# Patient Record
Sex: Male | Born: 1995 | Race: Black or African American | Hispanic: No | Smoking: Never smoker
Health system: Southern US, Community
[De-identification: ages and names within clinical notes are randomized; demographics above are authoritative.]

## PROBLEM LIST (undated history)

## (undated) HISTORY — PX: HERNIA REPAIR: SHX51

---
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
## 2021-04-13 ENCOUNTER — Other Ambulatory Visit: Payer: Self-pay

## 2021-04-13 ENCOUNTER — Ambulatory Visit
Admission: EM | Admit: 2021-04-13 | Discharge: 2021-04-13 | Disposition: A | Discharge status: Home / Self Care | Payer: Managed Care, Other (non HMO) | Attending: Emergency Medicine | Admitting: Emergency Medicine

## 2021-04-13 DIAGNOSIS — M546 Pain in thoracic spine: Secondary | ICD-10-CM

## 2021-04-13 DIAGNOSIS — M25511 Pain in right shoulder: Secondary | ICD-10-CM

## 2021-04-13 MED ORDER — IBUPROFEN 800 MG PO TABS: 800.0000 mg | ORAL_TABLET | Freq: Three times a day (TID) | ORAL | 0 refills | Status: DC

## 2021-04-13 MED ORDER — TIZANIDINE HCL 2 MG PO TABS: 2.0000 mg | ORAL_TABLET | Freq: Four times a day (QID) | ORAL | 0 refills | Status: DC | PRN
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
## 2021-04-13 NOTE — Discharge Instructions (Signed)
Use anti-inflammatories for pain/swelling. You may take up to 800 mg Ibuprofen every 8 hours with food. You may supplement Ibuprofen with Tylenol [phone] mg every 8 hours.  You may use tizanidine as needed to help with pain. This is a muscle relaxer and causes sedation- please use only at bedtime or when you will be home and not have to drive/work Gentle stretching Alternate ice and heat Follow-up if not improving or worsening
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
## 2021-06-06 ENCOUNTER — Encounter (HOSPITAL_COMMUNITY): Payer: Self-pay

## 2021-06-06 ENCOUNTER — Ambulatory Visit (HOSPITAL_COMMUNITY)
Admission: RE | Admit: 2021-06-06 | Discharge: 2021-06-06 | Disposition: A | Discharge status: Home / Self Care | Payer: Self-pay | Source: Ambulatory Visit | Attending: Internal Medicine | Admitting: Internal Medicine

## 2021-06-06 ENCOUNTER — Other Ambulatory Visit: Payer: Self-pay

## 2021-06-06 VITALS — BP 116/78 | HR 81 | Temp 98.6°F | Resp 20

## 2021-06-06 DIAGNOSIS — J039 Acute tonsillitis, unspecified: Secondary | ICD-10-CM

## 2021-06-06 DIAGNOSIS — J029 Acute pharyngitis, unspecified: Secondary | ICD-10-CM

## 2021-06-06 LAB — POCT RAPID STREP A, ED / UC: Streptococcus, Group A Screen (Direct): NEGATIVE

## 2021-06-06 MED ORDER — AMOXICILLIN 875 MG PO TABS: 875.0000 mg | ORAL_TABLET | Freq: Two times a day (BID) | ORAL | 0 refills | Status: AC

## 2021-06-06 MED ORDER — PREDNISONE 20 MG PO TABS: 40.0000 mg | ORAL_TABLET | Freq: Every day | ORAL | 0 refills | Status: AC

## 2021-06-06 NOTE — Discharge Instructions (Addendum)
You are being treated for sore throat and acute tonsillitis with amoxicillin antibiotic and prednisone steroid to decrease inflammation and treat infection.  You have been provided with contact information for ear, nose, and throat specialist.  You may call them for further evaluation of chronically enlarged tonsils.

## 2021-06-06 NOTE — ED Triage Notes (Signed)
Pt presents with a sore throat and lymph node swelling on right side.   States he had a fever and chills last night.

## 2021-06-06 NOTE — ED Provider Notes (Signed)
[facility]    CSN: [id number] Arrival date & time: 06/06/21  1451      History   Chief Complaint Chief Complaint  Patient presents with   Appointment   Sore Throat    HPI Philip Owens is a 25 y.o. male.   Patient presents for 4-day history of sore throat, right lymph node swelling, and enlarged tonsils.  Patient states that he had a fever of T-max of 100 at home.  Denies any upper respiratory symptoms or ear pain.  Denies any known sick contacts.  Patient states that he had a negative COVID-19 test completed yesterday at [facility].   Sore Throat   History reviewed. No pertinent past medical history.  Patient Active Problem List   Diagnosis Date Noted   Bilateral knee pain 04/23/2012    History reviewed. No pertinent surgical history.     Home Medications    Prior to Admission medications   Medication Sig Start Date End Date Taking? Authorizing Provider  amoxicillin (AMOXIL) 875 MG tablet Take 1 tablet (875 mg total) by mouth 2 (two) times daily for 10 days. 06/06/21 06/16/21 Yes Lance Muss, FNP  predniSONE (DELTASONE) 20 MG tablet Take 2 tablets (40 mg total) by mouth daily for 5 days. 06/06/21 06/11/21 Yes Lance Muss, FNP  ibuprofen (ADVIL) 800 MG tablet Take 1 tablet (800 mg total) by mouth 3 (three) times daily. 04/13/21   Wieters, Hallie C, PA-C  tiZANidine (ZANAFLEX) 2 MG tablet Take 1-2 tablets (2-4 mg total) by mouth every 6 (six) hours as needed for muscle spasms. 04/13/21   Wieters, Junius Creamer, PA-C    Family History Family History  Problem Relation Age of Onset   Healthy Mother    Healthy Father    Sudden death Neg Hx    Hyperlipidemia Neg Hx    Heart attack Neg Hx    Diabetes Neg Hx    Hypertension Neg Hx     Social History Social History   Tobacco Use   Smoking status: Never   Smokeless tobacco: Never     Allergies   Patient has no known allergies.   Review of Systems Review of Systems Per HPI  Physical  Exam Triage Vital Signs ED Triage Vitals  Enc Vitals Group     BP 06/06/21 1524 116/78     Pulse Rate 06/06/21 1524 81     Resp 06/06/21 1524 20     Temp 06/06/21 1524 98.6 F (37 C)     Temp Source 06/06/21 1524 Oral     SpO2 06/06/21 1524 98 %     Weight --      Height --      Head Circumference --      Peak Flow --      Pain Score 06/06/21 1522 4     Pain Loc --      Pain Edu? --      Excl. in GC? --    No data found.  Updated Vital Signs BP 116/78 (BP Location: Right Arm)   Pulse 81   Temp 98.6 F (37 C) (Oral)   Resp 20   SpO2 98%   Visual Acuity Right Eye Distance:   Left Eye Distance:   Bilateral Distance:    Right Eye Near:   Left Eye Near:    Bilateral Near:     Physical Exam Constitutional:      General: He is not in acute distress.    Appearance:  Normal appearance.  HENT:     Head: Normocephalic and atraumatic.     Right Ear: Ear canal normal. A middle ear effusion is present. Tympanic membrane is not erythematous or bulging.     Left Ear: Tympanic membrane and ear canal normal. Tympanic membrane is not erythematous or bulging.     Nose: Nose normal.     Mouth/Throat:     Pharynx: Posterior oropharyngeal erythema present.     Tonsils: 3+ on the right. 3+ on the left.  Eyes:     Extraocular Movements: Extraocular movements intact.     Conjunctiva/sclera: Conjunctivae normal.  Neck:     Comments: Right cervical lymph node swelling. Cardiovascular:     Rate and Rhythm: Normal rate and regular rhythm.     Pulses: Normal pulses.     Heart sounds: Normal heart sounds.  Pulmonary:     Effort: Pulmonary effort is normal. No respiratory distress.     Breath sounds: Normal breath sounds.  Lymphadenopathy:     Cervical: Cervical adenopathy present.  Skin:    General: Skin is warm and dry.  Neurological:     General: No focal deficit present.     Mental Status: He is alert and oriented to person, place, and time. Mental status is at baseline.   Psychiatric:        Mood and Affect: Mood normal.        Behavior: Behavior normal.        Thought Content: Thought content normal.        Judgment: Judgment normal.     UC Treatments / Results  Labs (all labs ordered are listed, but only abnormal results are displayed) Labs Reviewed  POCT RAPID STREP A, ED / UC    EKG   Radiology No results found.  Procedures Procedures (including critical care time)  Medications Ordered in UC Medications - No data to display  Initial Impression / Assessment and Plan / UC Course  I have reviewed the triage vital signs and the nursing notes.  Pertinent labs & imaging results that were available during my care of the patient were reviewed by me and considered in my medical decision making (see chart for details).     Rapid strep test was negative in urgent care today.  Although, suspicious of either false negative strep throat test or other variant of strep causing sore throat due to appearance of posterior pharynx and enlarged tonsils on physical exam.  Will treat with amoxicillin x10 days and prednisone x5 days to decrease size of tonsils.  Patient does state that he has chronically enlarged tonsils, but these are larger than normal.  Patient referred to ENT specialist for further evaluation and management of chronically enlarged tonsils.  Throat culture is pending.Discussed strict return precautions. Patient verbalized understanding and is agreeable with plan.  Final Clinical Impressions(s) / UC Diagnoses   Final diagnoses:  Pharyngitis, unspecified etiology  Tonsillitis     Discharge Instructions      You are being treated for sore throat and acute tonsillitis with amoxicillin antibiotic and prednisone steroid to decrease inflammation and treat infection.  You have been provided with contact information for ear, nose, and throat specialist.  You may call them for further evaluation of chronically enlarged tonsils.     ED  Prescriptions     Medication Sig Dispense Auth. Provider   amoxicillin (AMOXIL) 875 MG tablet Take 1 tablet (875 mg total) by mouth 2 (two) times daily for 10 days.  20 tablet Lance Muss, FNP   predniSONE (DELTASONE) 20 MG tablet Take 2 tablets (40 mg total) by mouth daily for 5 days. 10 tablet Lance Muss, FNP      PDMP not reviewed this encounter.   Lance Muss, FNP 06/06/21 [phone]

## 2022-05-10 ENCOUNTER — Ambulatory Visit: Payer: Self-pay | Admitting: *Deleted

## 2022-12-20 ENCOUNTER — Emergency Department (HOSPITAL_COMMUNITY)
Admission: EM | Admit: 2022-12-20 | Discharge: 2022-12-20 | Disposition: A | Discharge status: Home / Self Care | Payer: 59 | Attending: Student | Admitting: Student

## 2022-12-20 ENCOUNTER — Other Ambulatory Visit: Payer: Self-pay

## 2022-12-20 ENCOUNTER — Emergency Department (HOSPITAL_COMMUNITY): Payer: 59

## 2022-12-20 ENCOUNTER — Encounter (HOSPITAL_COMMUNITY): Payer: Self-pay

## 2022-12-20 DIAGNOSIS — M25511 Pain in right shoulder: Secondary | ICD-10-CM

## 2022-12-20 MED ORDER — IBUPROFEN 600 MG PO TABS: 600.0000 mg | ORAL_TABLET | Freq: Four times a day (QID) | ORAL | 0 refills | Status: DC | PRN

## 2022-12-20 MED ORDER — LIDOCAINE 5 % EX PTCH
1.0000 | MEDICATED_PATCH | CUTANEOUS | Status: DC
  Administered 2022-12-20: 1 via TRANSDERMAL
  Filled 2022-12-20: qty 1

## 2022-12-20 MED ORDER — LIDOCAINE 5 % EX PTCH: 1.0000 | MEDICATED_PATCH | CUTANEOUS | 0 refills | Status: DC

## 2022-12-20 MED ORDER — IBUPROFEN 400 MG PO TABS
600.0000 mg | ORAL_TABLET | Freq: Once | ORAL | Status: AC
  Administered 2022-12-20: 600 mg via ORAL
  Filled 2022-12-20: qty 1

## 2022-12-20 NOTE — Discharge Instructions (Signed)
Seen today for right shoulder pain.  X-rays are normal, likely due to muscle strain or related to your rotator cuff tear.  Use a sling as needed for comfort and use the ibuprofen as needed for pain.  Come back to the ER for any new or worsening symptoms such as swelling fevers or chills or other worsening symptoms.
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
## 2022-12-20 NOTE — ED Triage Notes (Signed)
Pt presents with a sudden onset l shoulder pain that started when trying to reach over his head. Pt reports previous rotator cuff injury to that shoulder.

## 2022-12-20 NOTE — ED Notes (Signed)
Pt verbalizes understanding of discharge instructions. Opportunity for questions and answers were provided. Pt discharged from the ED.   ?

## 2023-02-14 ENCOUNTER — Emergency Department (HOSPITAL_COMMUNITY): Payer: Managed Care, Other (non HMO)

## 2023-02-14 ENCOUNTER — Emergency Department (HOSPITAL_COMMUNITY)
Admission: EM | Admit: 2023-02-14 | Discharge: 2023-02-14 | Disposition: A | Discharge status: Home / Self Care | Payer: Managed Care, Other (non HMO) | Attending: Emergency Medicine | Admitting: Emergency Medicine

## 2023-02-14 DIAGNOSIS — M25812 Other specified joint disorders, left shoulder: Secondary | ICD-10-CM | POA: Diagnosis not present

## 2023-02-14 DIAGNOSIS — M542 Cervicalgia: Secondary | ICD-10-CM | POA: Diagnosis not present

## 2023-02-14 DIAGNOSIS — Z23 Encounter for immunization: Secondary | ICD-10-CM | POA: Insufficient documentation

## 2023-02-14 DIAGNOSIS — S50312A Abrasion of left elbow, initial encounter: Secondary | ICD-10-CM | POA: Diagnosis not present

## 2023-02-14 DIAGNOSIS — M19019 Primary osteoarthritis, unspecified shoulder: Secondary | ICD-10-CM

## 2023-02-14 DIAGNOSIS — Y92410 Unspecified street and highway as the place of occurrence of the external cause: Secondary | ICD-10-CM | POA: Diagnosis not present

## 2023-02-14 DIAGNOSIS — M25562 Pain in left knee: Secondary | ICD-10-CM | POA: Diagnosis not present

## 2023-02-14 DIAGNOSIS — S59902A Unspecified injury of left elbow, initial encounter: Secondary | ICD-10-CM | POA: Diagnosis present

## 2023-02-14 LAB — I-STAT CHEM 8, ED
BUN: 9 mg/dL (ref 6–20)
Calcium, Ion: 1.18 mmol/L (ref 1.15–1.40)
Chloride: 100 mmol/L (ref 98–111)
Creatinine, Ser: 0.9 mg/dL (ref 0.61–1.24)
Glucose, Bld: 98 mg/dL (ref 70–99)
HCT: 46 % (ref 39.0–52.0)
Hemoglobin: 15.6 g/dL (ref 13.0–17.0)
Potassium: 3.6 mmol/L (ref 3.5–5.1)
Sodium: 141 mmol/L (ref 135–145)
TCO2: 29 mmol/L (ref 22–32)

## 2023-02-14 MED ORDER — FENTANYL CITRATE PF 50 MCG/ML IJ SOSY: 50.0000 ug | PREFILLED_SYRINGE | Freq: Once | INTRAMUSCULAR | Status: AC

## 2023-02-14 MED ORDER — TETANUS-DIPHTH-ACELL PERTUSSIS 5-2.5-18.5 LF-MCG/0.5 IM SUSY
0.5000 mL | PREFILLED_SYRINGE | Freq: Once | INTRAMUSCULAR | Status: AC
  Administered 2023-02-14: 0.5 mL via INTRAMUSCULAR

## 2023-02-14 MED ORDER — FENTANYL CITRATE PF 50 MCG/ML IJ SOSY
PREFILLED_SYRINGE | INTRAMUSCULAR | Status: AC
  Administered 2023-02-14: 50 ug via INTRAVENOUS
  Filled 2023-02-14: qty 1

## 2023-02-14 MED ORDER — OXYCODONE-ACETAMINOPHEN 5-325 MG PO TABS: 1.0000 | ORAL_TABLET | Freq: Four times a day (QID) | ORAL | 0 refills | Status: DC | PRN

## 2023-02-14 MED ORDER — MORPHINE SULFATE (PF) 4 MG/ML IV SOLN
4.0000 mg | Freq: Once | INTRAVENOUS | Status: AC
  Administered 2023-02-14: 4 mg via INTRAVENOUS
  Filled 2023-02-14: qty 1
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
## 2023-02-14 NOTE — Progress Notes (Signed)
Orthopedic Tech Progress Note Patient Details:  [place] L Plake December 22, 1995 [id number]  Ortho Devices Type of Ortho Device: Shoulder immobilizer Ortho Device/Splint Location: LUE Ortho Device/Splint Interventions: Ordered, Application, Adjustment   Post Interventions Patient Tolerated: Well Instructions Provided: Care of device, Adjustment of device  [place] A Commodore Bellew 02/14/2023, 1:53 PM

## 2023-02-14 NOTE — ED Notes (Signed)
Pt placed in c-collar per MD by this RN, Hennie Duos, and Jeanell Sparrow, MD.
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
## 2023-02-14 NOTE — ED Notes (Signed)
Pt verbalizes understanding of discharge instructions. Opportunity for questions and answers were provided. Pt discharged from the ED.   ?

## 2023-05-28 ENCOUNTER — Encounter (HOSPITAL_COMMUNITY): Payer: Self-pay

## 2023-05-28 ENCOUNTER — Emergency Department (HOSPITAL_COMMUNITY)
Admission: EM | Admit: 2023-05-28 | Discharge: 2023-05-28 | Disposition: A | Discharge status: Home / Self Care | Payer: No Typology Code available for payment source | Attending: Emergency Medicine | Admitting: Emergency Medicine

## 2023-05-28 DIAGNOSIS — R21 Rash and other nonspecific skin eruption: Secondary | ICD-10-CM | POA: Diagnosis present

## 2023-05-28 MED ORDER — CEPHALEXIN 500 MG PO CAPS: 500.0000 mg | ORAL_CAPSULE | Freq: Two times a day (BID) | ORAL | 0 refills | Status: DC

## 2023-05-28 MED ORDER — HYDROXYZINE HCL 25 MG PO TABS: 25.0000 mg | ORAL_TABLET | Freq: Three times a day (TID) | ORAL | 0 refills | Status: DC | PRN

## 2023-05-28 MED ORDER — TERBINAFINE HCL 1 % EX CREA: 1.0000 | TOPICAL_CREAM | Freq: Two times a day (BID) | CUTANEOUS | 0 refills | Status: DC

## 2023-05-28 NOTE — ED Provider Notes (Signed)
[place] EMERGENCY DEPARTMENT AT [facility] Provider Note   CSN: [id number] Arrival date & time: 05/28/23  0036     History  Chief Complaint  Patient presents with   Foot Pain    Philip Owens is a 27 y.o. male.  The history is provided by the patient.  Foot Pain  Philip Owens is a 27 y.o. male who presents to the Emergency Department complaining of foot rash.  Presents to the emergency department for evaluation of rash that started on bilateral dorsal feet about 1 week ago.  He describes the rash is painful and itchy.  No new creams, detergents, shoes or clothing.  No prior similar symptoms.  No systemic symptoms.  Denies associated fevers, chest pain, shortness of breath, nausea, vomiting, additional skin lesions.     Home Medications Prior to Admission medications   Medication Sig Start Date End Date Taking? Authorizing Provider  cephALEXin (KEFLEX) 500 MG capsule Take 1 capsule (500 mg total) by mouth 2 (two) times daily. 05/28/23  Yes Tilden Fossa, MD  hydrOXYzine (ATARAX) 25 MG tablet Take 1 tablet (25 mg total) by mouth every 8 (eight) hours as needed for itching. 05/28/23  Yes Tilden Fossa, MD  terbinafine (LAMISIL) 1 % cream Apply 1 Application topically 2 (two) times daily. 05/28/23  Yes Tilden Fossa, MD  ibuprofen (ADVIL) 600 MG tablet Take 1 tablet (600 mg total) by mouth every 6 (six) hours as needed. 12/20/22   Carmel Sacramento A, PA-C  lidocaine (LIDODERM) 5 % Place 1 patch onto the skin daily. Remove & Discard patch within 12 hours or as directed by MD 12/20/22   Carmel Sacramento A, PA-C  oxyCODONE-acetaminophen (PERCOCET/ROXICET) 5-325 MG tablet Take 1 tablet by mouth every 6 (six) hours as needed for severe pain. 02/14/23   Margarita Grizzle, MD  tiZANidine (ZANAFLEX) 2 MG tablet Take 1-2 tablets (2-4 mg total) by mouth every 6 (six) hours as needed for muscle spasms. 04/13/21   Wieters, Hallie C, PA-C      Allergies    Patient has no known  allergies.    Review of Systems   Review of Systems  All other systems reviewed and are negative.   Physical Exam Updated Vital Signs BP 125/67   Pulse 84   Temp 99.1 F (37.3 C)   Resp 16   SpO2 96%  Physical Exam Vitals and nursing note reviewed.  Constitutional:      Appearance: He is well-developed.  HENT:     Head: Normocephalic and atraumatic.  Cardiovascular:     Rate and Rhythm: Normal rate and regular rhythm.     Heart sounds: No murmur heard. Pulmonary:     Effort: Pulmonary effort is normal. No respiratory distress.     Breath sounds: Normal breath sounds.  Abdominal:     Palpations: Abdomen is soft.     Tenderness: There is no abdominal tenderness. There is no guarding or rebound.  Musculoskeletal:     Comments: 2+ DP pulses bilaterally.  There is a scaling rash to bilateral dorsal feet with local skin thickening.  There are areas to the right foot with crusting and ulceration to the distal midfoot.  There is mild local erythema in this area.  No purulent drainage.  No erythema, edema or tenderness extending up the legs.  No plantar lesions.  Skin:    General: Skin is warm and dry.  Neurological:     Mental Status: He is alert and oriented to  person, place, and time.  Psychiatric:        Behavior: Behavior normal.     ED Results / Procedures / Treatments   Labs (all labs ordered are listed, but only abnormal results are displayed) Labs Reviewed - No data to display  EKG None  Radiology No results found.  Procedures Procedures    Medications Ordered in ED Medications - No data to display  ED Course/ Medical Decision Making/ A&P                             Medical Decision Making Risk OTC drugs. Prescription drug management.   Patient here for evaluation of bilateral foot rash.  Examination has skin thickening and changes concerning for contact dermatitis although he has not had any exposures.  There is 1 small area that is concerning for  developing impetigo/early infection.  No evidence of abscess.  Will start on treatment for possible fungal process as well as antibiotics for early skin infection.  Discussed with patient importance of outpatient follow-up as well as return precautions for new or concerning symptoms.        Final Clinical Impression(s) / ED Diagnoses Final diagnoses:  Rash    Rx / DC Orders ED Discharge Orders          Ordered    cephALEXin (KEFLEX) 500 MG capsule  2 times daily        05/28/23 0320    terbinafine (LAMISIL) 1 % cream  2 times daily        05/28/23 0320    hydrOXYzine (ATARAX) 25 MG tablet  Every 8 hours PRN        05/28/23 0320              Tilden Fossa, MD 05/28/23 [phone]

## 2023-05-28 NOTE — ED Triage Notes (Signed)
Pt is coming in for rash like wounds on his feet bilaterally that is causing some pain an swelling, it is only present on the tops of the feet distal to his toes and progressing up towards the ankles. He thought it was athletes foot but not the areas of concern are oozing some. There is no edema in ankles or legs.

## 2023-05-28 NOTE — ED Notes (Signed)
Patient ambulatory to room with steady gait c/o bilateral feet rash. Patient states he thought it was athletes foot but it is open and oozing now. Area affected runs on top of feet from base of toes up towards ankles causes pain and swelling. Patient a/o x 4 respirations even and non labored

## 2023-06-10 ENCOUNTER — Telehealth: Payer: No Typology Code available for payment source | Admitting: Nurse Practitioner

## 2023-06-10 ENCOUNTER — Ambulatory Visit: Payer: Self-pay | Admitting: Pharmacist

## 2023-06-10 DIAGNOSIS — B356 Tinea cruris: Secondary | ICD-10-CM | POA: Diagnosis not present

## 2023-06-10 MED ORDER — CLOTRIMAZOLE 1 % EX CREA: 1.0000 | TOPICAL_CREAM | Freq: Two times a day (BID) | CUTANEOUS | 0 refills | Status: DC

## 2023-06-10 MED ORDER — FLUCONAZOLE 150 MG PO TABS: 150.0000 mg | ORAL_TABLET | ORAL | 0 refills | Status: AC

## 2023-06-10 NOTE — Progress Notes (Signed)
E-Visit for [employer]  We are sorry that you are not feeling well. Here is how we plan to help!  Based on what you shared with me it looks like you have tinea cruris, or "Jock Itch".  The symptoms of Jock Itch include red, peeling, itchy rash that affects the groin (crease where the leg meets the trunk).  This fungal infection can be spread through shared towels, clothing, bedding, or hard surfaces (particularly in moist areas) such as shower stalls, locker room floors, or pool area that has the fungus present. If you have a fungal infection on one part of your body, you can also spread it to other parts. For instance, men with a fungal infection on their feet sometimes spread it to their groin.  I am recommending:Clotrimazole 1% cream or gel, apply to area twice per day   Prescription medications are only indicated for an extensive rash or if over the counter treatments have failed.  I am prescribing:Fluconazole 150 mg once weekly for  four weeks (4 total doses)   HOME CARE:  Keep affected area clean, dry, and cool. Wash with soap and shampoo after sports or exercise and dry yourself well after bathing or swimming Wear cotton underwear and change them if they become damp or sweaty. Avoid using swimming pools, public showers, or baths.  GET HELP RIGHT AWAY IF:  Symptoms that don't away after treatment. Severe itching that persists. If your rash spreads or swells. If your rash begins to have drainage or smell. You develop a fever.  MAKE SURE YOU   Understand these instructions. Will watch your condition. Will get help right away if you are not doing well or get worse.  Thank you for choosing an e-visit.  Your e-visit answers were reviewed by a board certified advanced clinical practitioner to complete your personal care plan. Depending upon the condition, your plan could have included both over the counter or prescription medications.  Please review your pharmacy choice. Make  sure the pharmacy is open so you can pick up prescription now. If there is a problem, you may contact your provider through [employer] and have the prescription routed to another pharmacy.  Your safety is important to [place]. If you have drug allergies check your prescription carefully.   For the next 24 hours you can use MyChart to ask questions about today's visit, request a non-urgent call back, or ask for a work or school excuse. You will get an email in the next two days asking about your experience. I hope that your e-visit has been valuable and will speed your recovery.   References or for more information:  [email or web address]&source=search_result&selectedTitle=3~52&usage_type=default&display_rank=3   Meds ordered this encounter  Medications   fluconazole (DIFLUCAN) 150 MG tablet    Sig: Take 1 tablet (150 mg total) by mouth once a week for 4 days.    Dispense:  4 tablet    Refill:  0   clotrimazole (LOTRIMIN) 1 % cream    Sig: Apply 1 Application topically 2 (two) times daily.    Dispense:  30 g    Refill:  0    I spent approximately 5 minutes reviewing the patient's history, current symptoms and coordinating their care today.

## 2023-06-11 ENCOUNTER — Ambulatory Visit: Payer: No Typology Code available for payment source | Admitting: Pharmacist

## 2023-06-11 ENCOUNTER — Other Ambulatory Visit (HOSPITAL_COMMUNITY)
Admission: RE | Admit: 2023-06-11 | Discharge: 2023-06-11 | Disposition: A | Discharge status: Home / Self Care | Payer: No Typology Code available for payment source | Source: Ambulatory Visit | Attending: Infectious Disease | Admitting: Infectious Disease

## 2023-06-11 ENCOUNTER — Other Ambulatory Visit: Payer: Self-pay

## 2023-06-11 ENCOUNTER — Other Ambulatory Visit (HOSPITAL_COMMUNITY): Payer: Self-pay

## 2023-06-11 DIAGNOSIS — Z113 Encounter for screening for infections with a predominantly sexual mode of transmission: Secondary | ICD-10-CM

## 2023-06-11 DIAGNOSIS — A549 Gonococcal infection, unspecified: Secondary | ICD-10-CM

## 2023-06-11 MED ORDER — CEFTRIAXONE SODIUM 500 MG IJ SOLR
500.0000 mg | Freq: Once | INTRAMUSCULAR | Status: AC
  Administered 2023-06-11: 500 mg via INTRAMUSCULAR

## 2023-06-11 NOTE — Progress Notes (Addendum)
I have evaluated the chart below and refer this patient to the CPP clinic for medication management, adjustment, and treatment.      NEW REFERRAL TO CPP CLINIC    06/11/2023  HPI: Philip Owens is a 27 y.o. male who presents to the [facility] clinic today for STI testing.  Patient Active Problem List   Diagnosis Date Noted   Bilateral knee pain 04/23/2012    Patient's Medications  New Prescriptions   No medications on file  Previous Medications   CEPHALEXIN (KEFLEX) 500 MG CAPSULE    Take 1 capsule (500 mg total) by mouth 2 (two) times daily.   CLOTRIMAZOLE (LOTRIMIN) 1 % CREAM    Apply 1 Application topically 2 (two) times daily.   FLUCONAZOLE (DIFLUCAN) 150 MG TABLET    Take 1 tablet (150 mg total) by mouth once a week for 4 days.   HYDROXYZINE (ATARAX) 25 MG TABLET    Take 1 tablet (25 mg total) by mouth every 8 (eight) hours as needed for itching.   IBUPROFEN (ADVIL) 600 MG TABLET    Take 1 tablet (600 mg total) by mouth every 6 (six) hours as needed.   LIDOCAINE (LIDODERM) 5 %    Place 1 patch onto the skin daily. Remove & Discard patch within 12 hours or as directed by MD   OXYCODONE-ACETAMINOPHEN (PERCOCET/ROXICET) 5-325 MG TABLET    Take 1 tablet by mouth every 6 (six) hours as needed for severe pain.   TERBINAFINE (LAMISIL) 1 % CREAM    Apply 1 Application topically 2 (two) times daily.   TIZANIDINE (ZANAFLEX) 2 MG TABLET    Take 1-2 tablets (2-4 mg total) by mouth every 6 (six) hours as needed for muscle spasms.  Modified Medications   No medications on file  Discontinued Medications   No medications on file    Allergies: No Known Allergies  Past Medical History: No past medical history on file.  Social History: Social History   Socioeconomic History   Marital status: Divorced    Spouse name: Not on file   Number of children: Not on file   Years of education: Not on file   Highest education level: Not on file  Occupational History   Not on file  Tobacco  Use   Smoking status: Never   Smokeless tobacco: Never  Vaping Use   Vaping status: Every Day  Substance and Sexual Activity   Alcohol use: Never   Drug use: Never   Sexual activity: Not on file  Other Topics Concern   Not on file  Social History Narrative   Not on file   Social Determinants of Health   Financial Resource Strain: Not on file  Food Insecurity: Not on file  Transportation Needs: Not on file  Physical Activity: Not on file  Stress: Not on file  Social Connections: Not on file     Assessment: Philip presents to clinic today for STI screening. He endorses yellow-colored penile discharge for two weeks in conjunction with painful/burning urination with slight hematuria and difficulty voiding/urinary frequency. States he was sexually active with a male partner 4 days prior to these symptoms. He states he is sexually active with females only and participated in oral and insertive vaginal sex during his most recent encounter. Endorses condom use. States he has been exclusive with this male partner for the last couple months. States he has been diagnosed with STIs 3 times in the past but would like to defer starting PrEP at  this time. He could benefit from PrEP given his history of STIs, but he is using condoms and seems to have limited sexual partners. Discussed he is welcome back at any time to start PrEP should he become interested.   Will check urine cytologies today along with a urinalysis to test for gonorrhea, chlamydia, trichomonas, and a bacterial UTI. Will treat empirically for gonorrhea today with ceftriaxone. Will follow up on other results as needed. Will also check HIV antibody and syphilis testing today.   Plan: - STI screening: RPR, HIV antibody, urine GC/CT/trichomonas cytologies today - Administer ceftriaxone 500 mg x 1 - F/u results to see if treatment is needed  Margarite Gouge, PharmD, CPP, BCIDP, AAHIVP Clinical Pharmacist Practitioner Infectious  Diseases Clinical Pharmacist [facility]

## 2023-06-12 LAB — URINE CYTOLOGY ANCILLARY ONLY
Chlamydia: NEGATIVE
Comment: NEGATIVE
Comment: NEGATIVE
Comment: NORMAL
Neisseria Gonorrhea: POSITIVE — AB

## 2023-06-12 LAB — HIV ANTIBODY (ROUTINE TESTING W REFLEX): HIV 1&2 Ab, 4th Generation: NONREACTIVE

## 2023-06-12 LAB — RPR: RPR Ser Ql: NONREACTIVE

## 2023-06-13 ENCOUNTER — Encounter: Payer: Self-pay | Admitting: Pharmacist

## 2023-10-19 ENCOUNTER — Other Ambulatory Visit: Payer: Self-pay

## 2023-10-19 ENCOUNTER — Emergency Department (HOSPITAL_COMMUNITY)
Admission: EM | Admit: 2023-10-19 | Discharge: 2023-10-19 | Disposition: A | Discharge status: Home / Self Care | Payer: No Typology Code available for payment source | Attending: Emergency Medicine | Admitting: Emergency Medicine

## 2023-10-19 ENCOUNTER — Emergency Department (HOSPITAL_COMMUNITY): Payer: No Typology Code available for payment source

## 2023-10-19 ENCOUNTER — Encounter (HOSPITAL_COMMUNITY): Payer: Self-pay

## 2023-10-19 DIAGNOSIS — M25562 Pain in left knee: Secondary | ICD-10-CM | POA: Diagnosis present

## 2023-10-19 MED ORDER — HYDROCODONE-ACETAMINOPHEN 5-325 MG PO TABS: 1.0000 | ORAL_TABLET | ORAL | 0 refills | Status: DC | PRN

## 2023-10-19 MED ORDER — OXYCODONE-ACETAMINOPHEN 5-325 MG PO TABS
1.0000 | ORAL_TABLET | Freq: Once | ORAL | Status: AC
  Administered 2023-10-19: 1 via ORAL
  Filled 2023-10-19: qty 1

## 2023-10-19 NOTE — Progress Notes (Signed)
Orthopedic Tech Progress Note Patient Details:  Philip Owens 15-Aug-1996 [id number]  Ortho Devices Type of Ortho Device: Crutches, Knee Immobilizer Ortho Device/Splint Location: LLE Ortho Device/Splint Interventions: Ordered, Application, Adjustment   Post Interventions Patient Tolerated: Poor Instructions Provided: Care of device, Poper ambulation with device, Adjustment of device  [place] A [name] 10/19/2023, 7:43 AM

## 2023-10-19 NOTE — ED Provider Notes (Signed)
[place] EMERGENCY DEPARTMENT AT [facility] Provider Note   CSN: [id number] Arrival date & time: 10/19/23  0532     History  Chief Complaint  Patient presents with   Leg Injury    Philip Owens is a 27 y.o. male.  Patient presents to the emergency department with complaints of left knee injury.  Patient reports that he fell off of a step and landed directly on the left knee.  He is having severe pain and cannot bear weight.  No other injury.       Home Medications Prior to Admission medications   Medication Sig Start Date End Date Taking? Authorizing Provider  clotrimazole (LOTRIMIN) 1 % cream Apply 1 Application topically 2 (two) times daily. 06/10/23   Viviano Simas, FNP  HYDROcodone-acetaminophen (NORCO/VICODIN) 5-325 MG tablet Take 1 tablet by mouth every 4 (four) hours as needed for moderate pain (pain score 4-6). 10/19/23  Yes Gregroy Dombkowski, Canary Brim, MD      Allergies    Patient has no known allergies.    Review of Systems   Review of Systems  Physical Exam Updated Vital Signs BP (!) 143/80 (BP Location: Left Arm)   Pulse 98   Temp 98.3 F (36.8 C)   Resp 18   Ht 5' 10.75" (1.797 m)   Wt 80.7 kg   SpO2 100%   BMI 25.00 kg/m  Physical Exam Constitutional:      Appearance: Normal appearance.  HENT:     Head: Normocephalic and atraumatic.  Cardiovascular:     Pulses:          Dorsalis pedis pulses are 2+ on the left side.  Musculoskeletal:     Left knee: Swelling present. No deformity or erythema. Decreased range of motion. Tenderness present. Normal pulse.  Neurological:     Mental Status: He is alert.     Sensory: Sensation is intact.     Motor: Motor function is intact.     ED Results / Procedures / Treatments   Labs (all labs ordered are listed, but only abnormal results are displayed) Labs Reviewed - No data to display  EKG None  Radiology DG Knee 2 Views Left  Result Date: 10/19/2023 CLINICAL DATA:  Pain after fall  EXAM: LEFT KNEE - 2 VIEW COMPARISON:  04/22/2012 FINDINGS: No displaced fracture, dislocation, or degeneration. Question lateral femoral notch sign. Small volume suprapatellar joint fluid. IMPRESSION: Small joint effusion and possible lateral femoral notch depression, correlate for ACL instability. Electronically Signed   By: Tiburcio Pea M.D.   On: 10/19/2023 06:30    Procedures Procedures    Medications Ordered in ED Medications  oxyCODONE-acetaminophen (PERCOCET/ROXICET) 5-325 MG per tablet 1 tablet (has no administration in time range)    ED Course/ Medical Decision Making/ A&P                                 Medical Decision Making Risk Prescription drug management.   Presents with left knee injury after a fall.  Patient complaining of severe pain, inability to bear weight.  X-ray with possible lateral femoral notch depression and small effusion.  Discussed briefly with Dr. Hulda Humphrey, on-call for Ortho.  Knee immobilizer, analgesia and follow-up in clinic, does not require additional imaging this morning.        Final Clinical Impression(s) / ED Diagnoses Final diagnoses:  Acute pain of left knee    Rx / DC  Orders ED Discharge Orders          Ordered    HYDROcodone-acetaminophen (NORCO/VICODIN) 5-325 MG tablet  Every 4 hours PRN        10/19/23 0703              Gilda Crease, MD 10/19/23 [phone]

## 2023-10-19 NOTE — ED Triage Notes (Signed)
Pt arrived via POV s/p fall from third step directly onto hardwood floor on left knee. 10/10 pain. Pt states that he is not able to place any pressure on the knee. Left knee noted to be swollen.

## 2023-10-19 NOTE — Discharge Instructions (Signed)
The x-ray suggest that you may have an ACL injury.  The orthopedic surgeon wants you in this knee immobilizer and will see you in the office for further treatment.

## 2023-10-19 NOTE — ED Notes (Signed)
Ortho tech notified.  

## 2024-05-13 ENCOUNTER — Ambulatory Visit (HOSPITAL_COMMUNITY)

## 2024-06-22 ENCOUNTER — Other Ambulatory Visit: Payer: Self-pay

## 2024-06-22 ENCOUNTER — Emergency Department (HOSPITAL_COMMUNITY)
Admission: EM | Admit: 2024-06-22 | Discharge: 2024-06-23 | Disposition: A | Discharge status: Home / Self Care | Payer: Self-pay | Attending: Emergency Medicine | Admitting: Emergency Medicine

## 2024-06-22 DIAGNOSIS — K0889 Other specified disorders of teeth and supporting structures: Secondary | ICD-10-CM

## 2024-06-22 DIAGNOSIS — K029 Dental caries, unspecified: Secondary | ICD-10-CM | POA: Insufficient documentation

## 2024-06-22 MED ORDER — OXYCODONE-ACETAMINOPHEN 5-325 MG PO TABS
1.0000 | ORAL_TABLET | Freq: Once | ORAL | Status: AC
  Administered 2024-06-22: 1 via ORAL
  Filled 2024-06-22: qty 1

## 2024-06-22 NOTE — ED Triage Notes (Signed)
 Patient reports left upper molar pain for 2 months .

## 2024-06-23 MED ORDER — NAPROXEN 500 MG PO TABS: 500.0000 mg | ORAL_TABLET | Freq: Two times a day (BID) | ORAL | 0 refills | Status: AC

## 2024-06-23 NOTE — Discharge Instructions (Signed)
 You were seen this evening for dental pain.  Please follow-up with dentistry for definitive management of your dental pain.  I have prescribed Naprosyn , an anti-inflammatory to be taken twice daily.  Return to the emergency department if you develop any life-threatening symptoms.

## 2024-06-23 NOTE — ED Provider Notes (Signed)
 [place] EMERGENCY DEPARTMENT AT [facility] Provider Note   CSN: [id number] Arrival date & time: 06/22/24  2130     Patient presents with: Dental Pain   Philip Owens is a 28 y.o. male.  Patient presents to the emergency room complaining of 2 months of left upper dental pain.  He states he has an appointment with dentistry for later this month to address the problem.  He has tried Orajel at home but no other medications.  He denies fever, nausea, vomiting, other systemic symptoms.    Dental Pain      Prior to Admission medications   Medication Sig Start Date End Date Taking? Authorizing Provider  naproxen  (NAPROSYN ) 500 MG tablet Take 1 tablet (500 mg total) by mouth 2 (two) times daily. 06/23/24  Yes Logan Martinis B, PA-C  clotrimazole  (LOTRIMIN ) 1 % cream Apply 1 Application topically 2 (two) times daily. 06/10/23   Kennyth Domino, FNP  HYDROcodone -acetaminophen  (NORCO/VICODIN) 5-325 MG tablet Take 1 tablet by mouth every 4 (four) hours as needed for moderate pain (pain score 4-6). 10/19/23   Haze Lonni PARAS, MD    Allergies: Patient has no known allergies.    Review of Systems  Updated Vital Signs BP 122/79 (BP Location: Right Arm)   Pulse 68   Temp 98.5 F (36.9 C)   Resp 16   SpO2 100%   Physical Exam Vitals and nursing note reviewed.  HENT:     Head: Normocephalic and atraumatic.     Mouth/Throat:   Pulmonary:     Effort: Pulmonary effort is normal. No respiratory distress.  Musculoskeletal:        General: No signs of injury.     Cervical back: Normal range of motion.  Skin:    General: Skin is dry.  Neurological:     Mental Status: He is alert.  Psychiatric:        Speech: Speech normal.        Behavior: Behavior normal.     (all labs ordered are listed, but only abnormal results are displayed) Labs Reviewed - No data to display  EKG: None  Radiology: No results found.   Procedures   Medications Ordered in the ED   oxyCODONE -acetaminophen  (PERCOCET/ROXICET) 5-325 MG per tablet 1 tablet (1 tablet Oral Given 06/22/24 2217)                                    Medical Decision Making Risk OTC drugs. Prescription drug management.   Patient presents to the emergency department with a chief complaint of dental pain.  Differential includes dental abscess, broken tooth, dental cavity, others  The patient has normal phonation, no excess salivation.  He denies any systemic symptoms.  No sign of dental abscess.  I do not see an indication for imaging or lab work at this time.  Based on presentation I feel the patient would benefit most from dental follow-up.  I will provide a course of Naprosyn  to help with inflammation.  Patient stable for discharge home at this time.     Final diagnoses:  Pain, dental    ED Discharge Orders          Ordered    naproxen  (NAPROSYN ) 500 MG tablet  2 times daily        06/23/24 0146               Logan Martinis  B, PA-C 06/23/24 0146    Trine Raynell Moder, MD 06/23/24 [phone]

## 2024-06-24 ENCOUNTER — Other Ambulatory Visit: Payer: Self-pay

## 2024-06-24 ENCOUNTER — Emergency Department (HOSPITAL_COMMUNITY)
Admission: EM | Admit: 2024-06-24 | Discharge: 2024-06-25 | Disposition: A | Discharge status: Home / Self Care | Payer: Self-pay | Attending: Emergency Medicine | Admitting: Emergency Medicine

## 2024-06-24 DIAGNOSIS — X58XXXA Exposure to other specified factors, initial encounter: Secondary | ICD-10-CM | POA: Insufficient documentation

## 2024-06-24 DIAGNOSIS — R509 Fever, unspecified: Secondary | ICD-10-CM | POA: Insufficient documentation

## 2024-06-24 DIAGNOSIS — K0889 Other specified disorders of teeth and supporting structures: Secondary | ICD-10-CM

## 2024-06-24 DIAGNOSIS — S025XXA Fracture of tooth (traumatic), initial encounter for closed fracture: Secondary | ICD-10-CM | POA: Insufficient documentation

## 2024-06-24 NOTE — ED Triage Notes (Signed)
 Pt here for left upper tooth pain. Pt has hole in tooth and it is causing pain. Pt has dental appt on aug 28th

## 2024-06-25 MED ORDER — PENICILLIN V POTASSIUM 500 MG PO TABS: 500.0000 mg | ORAL_TABLET | Freq: Four times a day (QID) | ORAL | 0 refills | Status: AC

## 2024-06-25 MED ORDER — OXYCODONE-ACETAMINOPHEN 5-325 MG PO TABS
2.0000 | ORAL_TABLET | Freq: Once | ORAL | Status: AC
  Administered 2024-06-25: 2 via ORAL
  Filled 2024-06-25: qty 2

## 2024-06-25 MED ORDER — KETOROLAC TROMETHAMINE 60 MG/2ML IM SOLN
30.0000 mg | Freq: Once | INTRAMUSCULAR | Status: AC
  Administered 2024-06-25: 30 mg via INTRAMUSCULAR
  Filled 2024-06-25: qty 2

## 2024-06-25 MED ORDER — PENICILLIN V POTASSIUM 250 MG PO TABS
500.0000 mg | ORAL_TABLET | Freq: Once | ORAL | Status: AC
  Administered 2024-06-25: 500 mg via ORAL
  Filled 2024-06-25: qty 2

## 2024-06-25 NOTE — ED Provider Notes (Signed)
 [place] EMERGENCY DEPARTMENT AT [facility] Provider Note   CSN: [id number] Arrival date & time: 06/24/24  2343     Patient presents with: Dental Pain   Philip Owens is a 28 y.o. male.   28 year old male with a broken tooth here with recurrent pain.  Patient seen here couple days ago started on naproxen .  Got a dental appointment for the end of the month however yesterday had a fever of 101 and worsening pain so came in for evaluation.  No trouble swallowing, breathing or significant swelling.  Asked me if I can take the tooth out or call someone and to do it.   Dental Pain      Prior to Admission medications   Medication Sig Start Date End Date Taking? Authorizing Provider  penicillin  v potassium (VEETID) 500 MG tablet Take 1 tablet (500 mg total) by mouth 4 (four) times daily for 10 days. 06/25/24 07/05/24 Yes Feleshia Zundel, Selinda, MD  clotrimazole  (LOTRIMIN ) 1 % cream Apply 1 Application topically 2 (two) times daily. 06/10/23   Kennyth Domino, FNP  HYDROcodone -acetaminophen  (NORCO/VICODIN) 5-325 MG tablet Take 1 tablet by mouth every 4 (four) hours as needed for moderate pain (pain score 4-6). 10/19/23   Haze Lonni PARAS, MD  naproxen  (NAPROSYN ) 500 MG tablet Take 1 tablet (500 mg total) by mouth 2 (two) times daily. 06/23/24   Logan Ubaldo NOVAK, PA-C    Allergies: Patient has no known allergies.    Review of Systems  Updated Vital Signs BP 127/79 (BP Location: Right Arm)   Pulse 74   Temp 99.2 F (37.3 C)   Resp 16   Ht 5' 10 (1.778 m)   Wt 80.7 kg   SpO2 97%   BMI 25.53 kg/m   Physical Exam Vitals and nursing note reviewed.  Constitutional:      Appearance: He is well-developed.  HENT:     Head: Normocephalic and atraumatic.     Comments: Patient does have a broken tooth with some mild gingival erythema and edema.  No drainable abscess. Cardiovascular:     Rate and Rhythm: Normal rate.  Pulmonary:     Effort: Pulmonary effort is normal. No  respiratory distress.  Abdominal:     General: There is no distension.  Musculoskeletal:        General: Normal range of motion.     Cervical back: Normal range of motion.  Neurological:     Mental Status: He is alert.     (all labs ordered are listed, but only abnormal results are displayed) Labs Reviewed - No data to display  EKG: None  Radiology: No results found.   Procedures   Medications Ordered in the ED  oxyCODONE -acetaminophen  (PERCOCET/ROXICET) 5-325 MG per tablet 2 tablet (2 tablets Oral Given 06/25/24 0117)  penicillin  v potassium (VEETID) tablet 500 mg (500 mg Oral Given 06/25/24 0117)  ketorolac  (TORADOL ) injection 30 mg (30 mg Intramuscular Given 06/25/24 0118)                                    Medical Decision Making Risk Prescription drug management.   Patient now reports fever with worsening pain so we will initiate antibiotics.  Pain treated here we will continue with anti-inflammatories and antibiotics at home.  Resource guide for dental appointments provided seething an appointment earlier.  No indication for further workup in the hospital at this time.  Final diagnoses:  Pain, dental  Closed fracture of tooth, initial encounter    ED Discharge Orders          Ordered    penicillin  v potassium (VEETID) 500 MG tablet  4 times daily        06/25/24 0115               Kohei Antonellis, Selinda, MD 06/25/24 [phone]

## 2024-06-27 ENCOUNTER — Emergency Department (HOSPITAL_COMMUNITY)
Admission: EM | Admit: 2024-06-27 | Discharge: 2024-06-27 | Disposition: A | Discharge status: Home / Self Care | Payer: Self-pay | Attending: Emergency Medicine | Admitting: Emergency Medicine

## 2024-06-27 ENCOUNTER — Encounter (HOSPITAL_COMMUNITY): Payer: Self-pay | Admitting: Emergency Medicine

## 2024-06-27 ENCOUNTER — Emergency Department (HOSPITAL_COMMUNITY): Payer: Self-pay

## 2024-06-27 ENCOUNTER — Other Ambulatory Visit: Payer: Self-pay

## 2024-06-27 DIAGNOSIS — S0083XA Contusion of other part of head, initial encounter: Secondary | ICD-10-CM

## 2024-06-27 DIAGNOSIS — S299XXA Unspecified injury of thorax, initial encounter: Secondary | ICD-10-CM | POA: Insufficient documentation

## 2024-06-27 DIAGNOSIS — S60221A Contusion of right hand, initial encounter: Secondary | ICD-10-CM | POA: Insufficient documentation

## 2024-06-27 DIAGNOSIS — S0990XA Unspecified injury of head, initial encounter: Secondary | ICD-10-CM

## 2024-06-27 DIAGNOSIS — S060X0A Concussion without loss of consciousness, initial encounter: Secondary | ICD-10-CM | POA: Insufficient documentation

## 2024-06-27 DIAGNOSIS — S161XXA Strain of muscle, fascia and tendon at neck level, initial encounter: Secondary | ICD-10-CM | POA: Insufficient documentation

## 2024-06-27 DIAGNOSIS — S298XXA Other specified injuries of thorax, initial encounter: Secondary | ICD-10-CM

## 2024-06-27 MED ORDER — HYDROCODONE-ACETAMINOPHEN 5-325 MG PO TABS
2.0000 | ORAL_TABLET | Freq: Once | ORAL | Status: AC
  Administered 2024-06-27: 2 via ORAL
  Filled 2024-06-27: qty 2

## 2024-06-27 MED ORDER — HYDROCODONE-ACETAMINOPHEN 5-325 MG PO TABS
1.0000 | ORAL_TABLET | Freq: Four times a day (QID) | ORAL | 0 refills | Status: AC | PRN
Start: 2024-06-27 — End: ?

## 2024-06-27 NOTE — ED Notes (Signed)
C collar removed per MD 
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
